# Patient Record
Sex: Male | Born: 1990 | Race: Black or African American | Hispanic: No | Marital: Single | Smoking: Former smoker
Health system: Southern US, Community
[De-identification: ages and names within clinical notes are randomized; demographics above are authoritative.]

## PROBLEM LIST (undated history)

## (undated) ENCOUNTER — Ambulatory Visit: Admission: EM | Payer: Self-pay

## (undated) DIAGNOSIS — J9383 Other pneumothorax: Secondary | ICD-10-CM

## (undated) HISTORY — PX: HERNIA REPAIR: SHX51

## (undated) HISTORY — PX: LUNG SURGERY: SHX703

---

## 2017-04-17 ENCOUNTER — Ambulatory Visit
Admission: EM | Admit: 2017-04-17 | Discharge: 2017-04-17 | Disposition: A | Discharge status: Home / Self Care | Payer: 59 | Attending: Family Medicine | Admitting: Family Medicine

## 2017-04-17 ENCOUNTER — Encounter: Payer: Self-pay | Admitting: Emergency Medicine

## 2017-04-17 ENCOUNTER — Ambulatory Visit: Admission: EM | Admit: 2017-04-17 | Discharge: 2017-04-17 | Discharge status: Absent without leave | Payer: Self-pay

## 2017-04-17 DIAGNOSIS — G43909 Migraine, unspecified, not intractable, without status migrainosus: Secondary | ICD-10-CM | POA: Diagnosis not present

## 2017-04-17 NOTE — ED Provider Notes (Signed)
CSN: [id number]     Arrival date & time 04/17/17  1602 History   First MD Initiated Contact with Patient 04/17/17 1722     Chief Complaint  Patient presents with  . Headache   (Consider location/radiation/quality/duration/timing/severity/associated sxs/prior Treatment) Patient is a 26 year old male who presents with complaint of migraine. Patient states he got a migraine at work and took Tylenol about 2 hours ago. Patient states his headache has improved. Initially his pain was before head above the eyes and is now more of a generalized headache. Patient states these above the eyes type of headache is consistent with his migraines that he had as a child. Patient reports photophobia with this headache is the worst. Patient denies any numbness or tingling. Patient denies any vision changes.  Patient works doing all changes on semi trucks and requested to go home to sleep off his migraine. Patient states his boss told him he would need to be seen in get a note before he can return to work.      History reviewed. No pertinent past medical history. Past Surgical History:  Procedure Laterality Date  . HERNIA REPAIR     History reviewed. No pertinent family history. Social History  Substance Use Topics  . Smoking status: Current Every Day Smoker    Types: Cigarettes  . Smokeless tobacco: Never Used  . Alcohol use No    Review of Systems  as stated above in history of present illness. Other systems reviewed and found to be negative   Allergies  Patient has no known allergies.  Home Medications   Prior to Admission medications   Not on File   Meds Ordered and Administered this Visit  Medications - No data to display  BP 130/79 (BP Location: Left Arm)   Pulse 77   Temp 98.6 F (37 C) (Oral)   Resp 16   Ht 6\' 6"  (1.981 m)   Wt 180 lb (81.6 kg)   SpO2 100%   BMI 20.80 kg/m  No data found.   Physical Exam  Constitutional: He is oriented to person, place, and time. He  appears well-developed and well-nourished. No distress.  HENT:  Head: Normocephalic and atraumatic.  Eyes: Pupils are equal, round, and reactive to light. Conjunctivae and EOM are normal.  Cardiovascular: Normal rate and regular rhythm.   Pulmonary/Chest: Effort normal.  Musculoskeletal: Normal range of motion.  Neurological: He is alert and oriented to person, place, and time. He has normal strength. No cranial nerve deficit.  Is fine motor intact. Fine motor intact.  Skin: Skin is warm and dry.    Urgent Care Course     Procedures (including critical care time)  Labs Review Labs Reviewed - No data to display  Imaging Review No results found.   MDM   1. Migraine without status migrainosus, not intractable, unspecified migraine type    Patient presents with complaint of migraine that he had earlier today at work that has improved somewhat with Tylenol. Patient's migraine has gone from headache pain above both eyes (that is consistent with his migraines as a child) and now is more of a generalized headache. Patient refused Toradol for his pain. Patient to be allowed to return to work tomorrow.  Candis SchatzMichael D Laurrie Toppin, PA-C    Candis SchatzHarris, Chucky Homes D, PA-C 04/17/17 1737

## 2017-04-17 NOTE — ED Triage Notes (Signed)
Patient c/o HA that started today.  Patient states the has only taken Tylenol for his HA.  Patient states that he left work early today and needs a work note.

## 2017-04-17 NOTE — Discharge Instructions (Signed)
-   continue with Tylenol as needed for pain - avoid aggravating factor

## 2017-06-12 ENCOUNTER — Ambulatory Visit
Admission: EM | Admit: 2017-06-12 | Discharge: 2017-06-12 | Disposition: A | Discharge status: Home / Self Care | Payer: 59 | Attending: Family Medicine | Admitting: Family Medicine

## 2017-06-12 ENCOUNTER — Encounter: Payer: Self-pay | Admitting: *Deleted

## 2017-06-12 ENCOUNTER — Ambulatory Visit (INDEPENDENT_AMBULATORY_CARE_PROVIDER_SITE_OTHER): Payer: 59

## 2017-06-12 DIAGNOSIS — S60222A Contusion of left hand, initial encounter: Secondary | ICD-10-CM

## 2017-06-12 DIAGNOSIS — S0083XA Contusion of other part of head, initial encounter: Secondary | ICD-10-CM | POA: Diagnosis not present

## 2017-06-12 DIAGNOSIS — S62613A Displaced fracture of proximal phalanx of left middle finger, initial encounter for closed fracture: Secondary | ICD-10-CM
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
## 2017-06-12 NOTE — Discharge Instructions (Signed)
Ice. Keep in splint. Rest. Drink plenty of fluids.   Follow up with orthopedic in one week.   Follow up with your primary care physician this week as needed. Return to Urgent care for new or worsening concerns.

## 2018-03-25 ENCOUNTER — Ambulatory Visit
Admission: EM | Admit: 2018-03-25 | Discharge: 2018-03-25 | Disposition: A | Discharge status: Nursing Home (Includes ICF) | Payer: Self-pay | Attending: Emergency Medicine | Admitting: Emergency Medicine

## 2018-03-25 ENCOUNTER — Ambulatory Visit (INDEPENDENT_AMBULATORY_CARE_PROVIDER_SITE_OTHER): Payer: Self-pay

## 2018-03-25 DIAGNOSIS — F17210 Nicotine dependence, cigarettes, uncomplicated: Secondary | ICD-10-CM | POA: Insufficient documentation

## 2018-03-25 DIAGNOSIS — Z9889 Other specified postprocedural states: Secondary | ICD-10-CM | POA: Insufficient documentation

## 2018-03-25 DIAGNOSIS — R9389 Abnormal findings on diagnostic imaging of other specified body structures: Secondary | ICD-10-CM | POA: Insufficient documentation

## 2018-03-25 DIAGNOSIS — J930 Spontaneous tension pneumothorax: Secondary | ICD-10-CM

## 2018-03-25 DIAGNOSIS — R079 Chest pain, unspecified: Secondary | ICD-10-CM

## 2018-03-25 DIAGNOSIS — R0602 Shortness of breath: Secondary | ICD-10-CM | POA: Insufficient documentation

## 2018-03-25 NOTE — ED Provider Notes (Addendum)
[facility]    CSN: [id number] Arrival date & time: 03/25/18  1234     History   Chief Complaint Chief Complaint  Patient presents with  . Chest Pain    HPI Anthony Farmer is a 27 y.o. male.   HPI  27 year old male states that yesterday was doing fine but woke up this morning with chest pain the left side of his chest with the shortness of breath shoulder pain extending into his left neck.  States it also radiates into his back.  The patient states that when he lies back or lying flat is when it is the most severe.  He has no other chest pain.  He has no previous medical problems.  Is not having any respiratory distress at the present time.       History reviewed. No pertinent past medical history.  There are no active problems to display for this patient.   Past Surgical History:  Procedure Laterality Date  . HERNIA REPAIR         Home Medications    Prior to Admission medications   Not on File    Family History No family history on file.  Social History Social History   Tobacco Use  . Smoking status: Current Every Day Smoker    Types: Cigarettes  . Smokeless tobacco: Never Used  Substance Use Topics  . Alcohol use: No  . Drug use: No     Allergies   Patient has no known allergies.   Review of Systems Review of Systems  Constitutional: Positive for activity change. Negative for chills, fatigue and fever.  Respiratory: Positive for shortness of breath.   All other systems reviewed and are negative.    Physical Exam Triage Vital Signs ED Triage Vitals  Enc Vitals Group     BP 03/25/18 1259 118/77     Pulse Rate 03/25/18 1259 64     Resp 03/25/18 1259 18     Temp 03/25/18 1259 98.6 F (37 C)     Temp Source 03/25/18 1259 Oral     SpO2 03/25/18 1259 100 %     Weight 03/25/18 1301 185 lb (83.9 kg)     Height --      Head Circumference --      Peak Flow --      Pain Score 03/25/18 1301 8     Pain Loc --      Pain Edu?  --      Excl. in GC? --    No data found.  Updated Vital Signs BP 118/77 (BP Location: Left Arm)   Pulse 64   Temp 98.6 F (37 C) (Oral)   Resp 18   Wt 185 lb (83.9 kg)   SpO2 100%   BMI 21.94 kg/m   Visual Acuity Right Eye Distance:   Left Eye Distance:   Bilateral Distance:    Right Eye Near:   Left Eye Near:    Bilateral Near:     Physical Exam  Constitutional: He is oriented to person, place, and time. He appears well-developed and well-nourished.  Non-toxic appearance. He does not appear ill. No distress.  HENT:  Head: Normocephalic.  Eyes: Pupils are equal, round, and reactive to light.  Neck: Normal range of motion.  Cardiovascular: Normal rate and regular rhythm.  Pulmonary/Chest: Effort normal and breath sounds normal. No respiratory distress.  Musculoskeletal: Normal range of motion.  Neurological: He is alert and oriented to person, place, and time.  Skin: Skin is warm and dry.  Psychiatric: He has a normal mood and affect. His behavior is normal.  Nursing note and vitals reviewed.    UC Treatments / Results  Labs (all labs ordered are listed, but only abnormal results are displayed) Labs Reviewed - No data to display  EKG None  Radiology Dg Chest 2 View  Result Date: 03/25/2018 CLINICAL DATA:  LEFT sided chest pain since this morning. Symptoms confirmed with Dr. Phillis Knackoemer. EXAM: CHEST - 2 VIEW COMPARISON:  None. FINDINGS: There is a large LEFT pneumothorax, estimated to be 50% of lung volume. There is depression of the LEFT hemidiaphragm and minimal LEFT-to-RIGHT mediastinal shift. RIGHT lung is clear. No acute fracture. IMPRESSION: LEFT tension pneumothorax. Critical Value/emergent results were called by telephone at the time of interpretation on 03/25/2018 at 2:37 pm to Dr. Ovid CurdWILLIAM Keelyn Fjelstad , who verbally acknowledged these results. Electronically Signed   By: Norva PavlovElizabeth  Brown M.D.   On: 03/25/2018 15:01    Procedures Procedures (including critical care  time)  Medications Ordered in UC Medications - No data to display  Initial Impression / Assessment and Plan / UC Course  I have reviewed the triage vital signs and the nursing notes.  Pertinent labs & imaging results that were available during my care of the patient were reviewed by me and considered in my medical decision making (see chart for details).     . Patient found to have a pneumothorax of 50% on the left with a tension component.  Presently  stable with O2 sats at 100%.  Fully explained to the patient and his wife.  Send him by EMS for chest tube thoracostomy . Final Clinical Impressions(s) / UC Diagnoses   Final diagnoses:  Spontaneous tension pneumothorax   Discharge Instructions   None    ED Prescriptions    None     Controlled Substance Prescriptions [place] Controlled Substance Registry consulted? Not Applicable   Lutricia FeilRoemer, Roselina Burgueno P, PA-C 03/25/18 1830    Lutricia Feiloemer, Yannely Kintzel P, PA-C 03/25/18 1832

## 2018-03-25 NOTE — ED Triage Notes (Signed)
Pt states he woke up this morning with chest pain on the left side of his chest, SOB, left shoulder pain and left sided neck pain. Also states it radiates to his back. Never had this before pt reported. No otc meds tried.

## 2018-10-05 ENCOUNTER — Other Ambulatory Visit: Payer: Self-pay

## 2018-10-05 ENCOUNTER — Emergency Department: Payer: Self-pay

## 2018-10-05 ENCOUNTER — Emergency Department
Admission: EM | Admit: 2018-10-05 | Discharge: 2018-10-05 | Disposition: A | Discharge status: Home / Self Care | Payer: Self-pay | Attending: Emergency Medicine | Admitting: Emergency Medicine

## 2018-10-05 ENCOUNTER — Encounter: Payer: Self-pay | Admitting: Emergency Medicine

## 2018-10-05 DIAGNOSIS — Z8709 Personal history of other diseases of the respiratory system: Secondary | ICD-10-CM | POA: Insufficient documentation

## 2018-10-05 DIAGNOSIS — F17210 Nicotine dependence, cigarettes, uncomplicated: Secondary | ICD-10-CM | POA: Insufficient documentation

## 2018-10-05 DIAGNOSIS — Z880 Allergy status to penicillin: Secondary | ICD-10-CM | POA: Insufficient documentation

## 2018-10-05 DIAGNOSIS — Z902 Acquired absence of lung [part of]: Secondary | ICD-10-CM | POA: Insufficient documentation

## 2018-10-05 DIAGNOSIS — R079 Chest pain, unspecified: Secondary | ICD-10-CM | POA: Insufficient documentation

## 2018-10-05 HISTORY — DX: Other pneumothorax: J93.83

## 2018-10-05 LAB — BASIC METABOLIC PANEL
Anion gap: 5 (ref 5–15)
BUN: 12 mg/dL (ref 6–20)
CHLORIDE: 105 mmol/L (ref 98–111)
CO2: 27 mmol/L (ref 22–32)
CREATININE: 1.24 mg/dL (ref 0.61–1.24)
Calcium: 8.6 mg/dL — ABNORMAL LOW (ref 8.9–10.3)
Glucose, Bld: 96 mg/dL (ref 70–99)
POTASSIUM: 3.8 mmol/L (ref 3.5–5.1)
Sodium: 137 mmol/L (ref 135–145)

## 2018-10-05 LAB — CBC
HCT: 43.5 % (ref 39.0–52.0)
HEMOGLOBIN: 14.4 g/dL (ref 13.0–17.0)
MCH: 28.9 pg (ref 26.0–34.0)
MCHC: 33.1 g/dL (ref 30.0–36.0)
MCV: 87.3 fL (ref 80.0–100.0)
PLATELETS: 218 10*3/uL (ref 150–400)
RBC: 4.98 MIL/uL (ref 4.22–5.81)
RDW: 14.1 % (ref 11.5–15.5)
WBC: 4.3 10*3/uL (ref 4.0–10.5)
nRBC: 0 % (ref 0.0–0.2)

## 2018-10-05 LAB — TROPONIN I: Troponin I: <0.03 ng/mL (ref <0.03)

## 2018-10-05 MED ORDER — IOHEXOL 350 MG/ML SOLN
75.0000 mL | Freq: Once | INTRAVENOUS | Status: AC | PRN
  Administered 2018-10-05: 75 mL via INTRAVENOUS

## 2018-10-05 MED ORDER — ACETAMINOPHEN 500 MG PO TABS
1000.0000 mg | ORAL_TABLET | Freq: Once | ORAL | Status: DC
  Filled 2018-10-05: qty 2

## 2018-10-05 NOTE — ED Provider Notes (Signed)
[facility] Emergency Department Provider Note  ____________________________________________  Time seen: Approximately 8:55 AM  I have reviewed the triage vital signs and the nursing notes.   HISTORY  Chief Complaint Chest Pain    HPI Anthony Farmer is a 28 y.o. male with a history of spontaneous left pneumothorax 7/19 who underwent left upper lobe wedge resection to remove multiple apical blebs, pleurodesis, presenting with left-sided chest pain.  Patient reports that for the past 3 days, he has had a sharp left-sided chest pain that is worse with deep breaths and constant.  He has no associated palpitations, diaphoresis, nausea or vomiting.  He has not had any cough or cold symptoms, fevers or chills.  No lower extremity swelling or calf pain.  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  Past Medical History:  Diagnosis Date  . Spontaneous pneumothorax     There are no active problems to display for this patient.   Past Surgical History:  Procedure Laterality Date  . HERNIA REPAIR    . LUNG SURGERY        Allergies Penicillin g and Tape  History reviewed. No pertinent family history.  Social History Social History   Tobacco Use  . Smoking status: Current Every Day Smoker    Types: Cigarettes  . Smokeless tobacco: Never Used  Substance Use Topics  . Alcohol use: Yes    Comment: Occ  . Drug use: No    Review of Systems Constitutional: No fever/chills. Eyes: No visual changes. ENT: No sore throat. No congestion or rhinorrhea. Cardiovascular: Positive chest pain. Denies palpitations. Respiratory: Denies shortness of breath.  No cough. Gastrointestinal: No abdominal pain.  No nausea, no vomiting.  No diarrhea.  No constipation. Genitourinary: Negative for dysuria. Musculoskeletal: Negative for back pain. Skin: Negative for rash. Neurological: Negative for headaches. No focal numbness, tingling or weakness.      ____________________________________________   PHYSICAL EXAM:  VITAL SIGNS: ED Triage Vitals [10/05/18 0839]  Enc Vitals Group     BP 122/71     Pulse Rate 80     Resp 16     Temp      Temp src      SpO2 100 %     Weight 170 lb (77.1 kg)     Height 6\' 5"  (1.956 m)     Head Circumference      Peak Flow      Pain Score 5     Pain Loc      Pain Edu?      Excl. in GC?     Constitutional: Alert and oriented.  Answers questions appropriately. Eyes: Conjunctivae are normal.  EOMI. No scleral icterus. Head: Atraumatic. Nose: No congestion/rhinnorhea. Mouth/Throat: Mucous membranes are moist.  Neck: No stridor.  Supple.  No JVD. Cardiovascular: Normal rate, regular rhythm. No murmurs, rubs or gallops.  Respiratory: Normal respiratory effort.  No accessory muscle use or retractions. Lungs CTAB in the upper and lower lobes, from the lateral aspect, and auscultated from the front of the chest as well..  No wheezes, rales or ronchi.  O2 sats are 100% on room air. Gastrointestinal: Soft, nontender and nondistended.  No guarding or rebound.  No peritoneal signs. Musculoskeletal: No LE edema. No ttp in the calves or palpable cords.  Negative Homan's sign. Neurologic:  A&Ox3.  Speech is clear.  Face and smile are symmetric.  EOMI.  Moves all extremities well. Skin:  Skin is warm, dry and intact.  No rash noted. Psychiatric: Mood and affect are normal. Speech and behavior are normal.  Normal judgement.  ____________________________________________   LABS (all labs ordered are listed, but only abnormal results are displayed)  Labs Reviewed  BASIC METABOLIC PANEL - Abnormal; Notable for the following components:      Result Value   Calcium 8.6 (*)    All other components within normal limits  CBC  TROPONIN I   ____________________________________________  EKG  ED ECG REPORT I, Anne-Caroline Sharma Covert, the attending physician, personally viewed and interpreted this ECG.    Date: 10/05/2018  EKG Time: 839  Rate: 81  Rhythm: normal sinus rhythm  Axis: normal  Intervals:none  ST&T Change: No STEMI  ____________________________________________  RADIOLOGY  Dg Chest 2 View  Result Date: 10/05/2018 CLINICAL DATA:  Left-sided chest pain with deep breaths. EXAM: CHEST - 2 VIEW COMPARISON:  03/25/2018 FINDINGS: Lungs appear hyperexpanded. The lungs are clear without focal pneumonia, edema, pneumothorax or pleural effusion. Staple line noted left lung apex. The cardiopericardial silhouette is within normal limits for size. The visualized bony structures of the thorax are intact. IMPRESSION: No active cardiopulmonary disease.  No evidence for pneumothorax. Electronically Signed   By: Kennith Center M.D.   On: 10/05/2018 09:11   Ct Angio Chest Pe W And/or Wo Contrast  Result Date: 10/05/2018 CLINICAL DATA:  Chest pain EXAM: CT ANGIOGRAPHY CHEST WITH CONTRAST TECHNIQUE: Multidetector CT imaging of the chest was performed using the standard protocol during bolus administration of intravenous contrast. Multiplanar CT image reconstructions and MIPs were obtained to evaluate the vascular anatomy. CONTRAST:  10mL OMNIPAQUE IOHEXOL 350 MG/ML SOLN COMPARISON:  Chest radiograph October 05, 2018 FINDINGS: Cardiovascular: There is no demonstrable pulmonary embolus. There is no thoracic aortic aneurysm or dissection. Visualized great vessels appear unremarkable. No pericardial effusion or pericardial thickening evident. Mediastinum/Nodes: Visualized thyroid appears unremarkable. There is no evident thoracic adenopathy. No esophageal lesions are appreciable. Lungs/Pleura: There is no pneumothorax evident. There is scarring in the left upper lobe toward the apex. There is no appreciable edema or consolidation. There is no pleural effusion or pleural thickening evident. Upper Abdomen: Visualized upper abdominal structures appear unremarkable. Musculoskeletal: No blastic or lytic bone lesions. No  evident chest wall lesions. Review of the MIP images confirms the above findings. IMPRESSION: 1. No demonstrable pulmonary embolus. No thoracic aortic aneurysm or dissection. 2. Scarring left upper lobe toward the apex. No edema or consolidation. No evident pneumothorax. 3.  No demonstrable thoracic adenopathy. Electronically Signed   By: Bretta Bang III M.D.   On: 10/05/2018 11:23    ____________________________________________   PROCEDURES  Procedure(s) performed: None  Procedures  Critical Care performed: No ____________________________________________   INITIAL IMPRESSION / ASSESSMENT AND PLAN / ED COURSE  Pertinent labs & imaging results that were available during my care of the patient were reviewed by me and considered in my medical decision making (see chart for details).  28 y.o. male with a prior history of pneumothorax and left upper lobe wedge resection for blebs, pleurodesis, presenting with left-sided chest pain.  Overall, the patient is hemodynamically stable.  He has no evidence of respiratory compromise at this time and his auscultation exam does reveal normal breath sounds on the left side.  A chest x-ray has been ordered.  If this is normal, we will plan to do a CT scan given his extensive pulmonary history.  Basic laboratory studies have also been ordered.  A cardiac cause for the patient symptoms is unlikely,  but a troponin has been ordered.  I do not see ischemic changes or arrhythmia on his EKG.  I have ordered acetaminophen for his pain.  Plan reevaluation for final disposition.  ----------------------------------------- 11:51 AM on 10/05/2018 -----------------------------------------  The patient's CT scan does not show any acute causes for his chest pain.  His troponin is negative.  His electrolytes and blood counts are also within normal limits.  The patient's pain has resolved with Tylenol.  At this time, the patient is safe for discharge home.  I did  discuss follow-up instructions as well as return precautions with him.  ____________________________________________  FINAL CLINICAL IMPRESSION(S) / ED DIAGNOSES  Final diagnoses:  Left-sided chest pain         NEW MEDICATIONS STARTED DURING THIS VISIT:  New Prescriptions   No medications on file      Rockne Menghini, MD 10/05/18 1152

## 2018-10-05 NOTE — ED Triage Notes (Signed)
History of spontaneous pneumothorax 2 months ago with chest tube X 2.  2-3 days ago started with left side chest pain feeling similar.  Unlabored at this time with stable vitals.  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  Tall and slender composure.  Left side non radiating chest pain.

## 2018-10-05 NOTE — Discharge Instructions (Addendum)
You may take Tylenol or Motrin for your chest pain.  Please return to the emergency department if you develop severe pain, lightheadedness or fainting, fever, shortness of breath, or any other symptoms concerning to you.

## 2019-11-22 ENCOUNTER — Emergency Department (HOSPITAL_COMMUNITY)
Admission: EM | Admit: 2019-11-22 | Discharge: 2019-11-22 | Disposition: A | Discharge status: Home / Self Care | Payer: Self-pay | Attending: Emergency Medicine | Admitting: Emergency Medicine

## 2019-11-22 ENCOUNTER — Emergency Department (HOSPITAL_COMMUNITY): Payer: Self-pay

## 2019-11-22 ENCOUNTER — Encounter (HOSPITAL_COMMUNITY): Payer: Self-pay | Admitting: Emergency Medicine

## 2019-11-22 ENCOUNTER — Other Ambulatory Visit: Payer: Self-pay

## 2019-11-22 DIAGNOSIS — Z79899 Other long term (current) drug therapy: Secondary | ICD-10-CM | POA: Insufficient documentation

## 2019-11-22 DIAGNOSIS — S0101XA Laceration without foreign body of scalp, initial encounter: Secondary | ICD-10-CM | POA: Insufficient documentation

## 2019-11-22 DIAGNOSIS — Y939 Activity, unspecified: Secondary | ICD-10-CM | POA: Insufficient documentation

## 2019-11-22 DIAGNOSIS — S0990XA Unspecified injury of head, initial encounter: Secondary | ICD-10-CM | POA: Insufficient documentation

## 2019-11-22 DIAGNOSIS — F17210 Nicotine dependence, cigarettes, uncomplicated: Secondary | ICD-10-CM | POA: Insufficient documentation

## 2019-11-22 DIAGNOSIS — Y999 Unspecified external cause status: Secondary | ICD-10-CM | POA: Insufficient documentation

## 2019-11-22 DIAGNOSIS — Y929 Unspecified place or not applicable: Secondary | ICD-10-CM | POA: Insufficient documentation

## 2019-11-22 DIAGNOSIS — W228XXA Striking against or struck by other objects, initial encounter: Secondary | ICD-10-CM | POA: Insufficient documentation

## 2019-11-22 MED ORDER — ACETAMINOPHEN 325 MG PO TABS
650.0000 mg | ORAL_TABLET | Freq: Once | ORAL | Status: AC
  Administered 2019-11-22: 650 mg via ORAL
  Filled 2019-11-22: qty 2
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
## 2019-11-22 NOTE — ED Notes (Addendum)
Patient has no difficulty hearing this Clinical research associate while asking assessment questions.   Patient has dried blood present on LFT sided ear.
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
## 2019-11-22 NOTE — ED Notes (Signed)
Patient's LFT ear was cleaned with normal saline. Patient has no complaints of pain.

## 2019-11-22 NOTE — Discharge Instructions (Addendum)
As discussed, your CT scan was negative for any abnormalities. You may take over the counter ibuprofen or tylenol as needed for pain. I have included the number for [facility] number. Call to schedule an appointment if symptoms do not improve within the next week. Return to the ER for new or worsening symptoms.

## 2020-06-15 IMAGING — CT CT HEAD W/O CM
3 series · 15 of 47 positions shown, 18 images · non-contrast
Comparison: None.

CLINICAL DATA: Head trauma. Headache.

EXAM:
CT HEAD WITHOUT CONTRAST
TECHNIQUE: Contiguous axial images were obtained from the base of the skull
through the vertex without intravenous contrast.

[Series 2: head wo · axial · 0.47mm/px · z∈[-136,-11]mm · 9 of 31 slices shown, 12 images]
[im 3/31  brain]
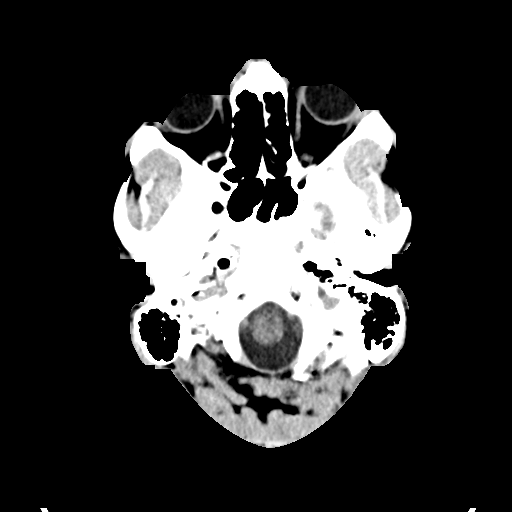
[im 3/31  bone]
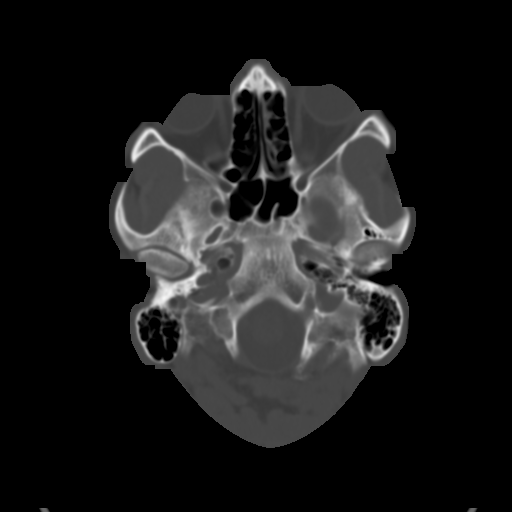
[im 6/31  brain]
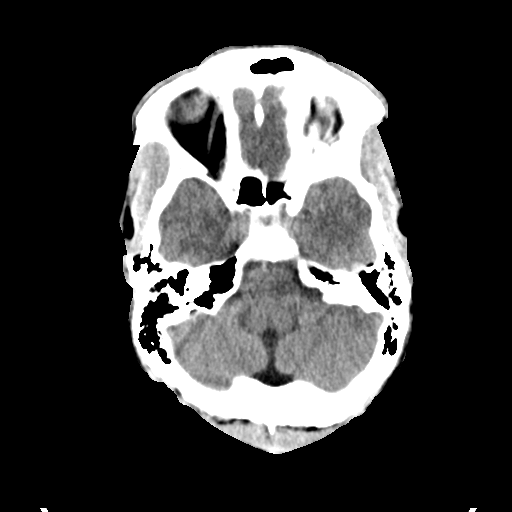
[im 9/31  brain]
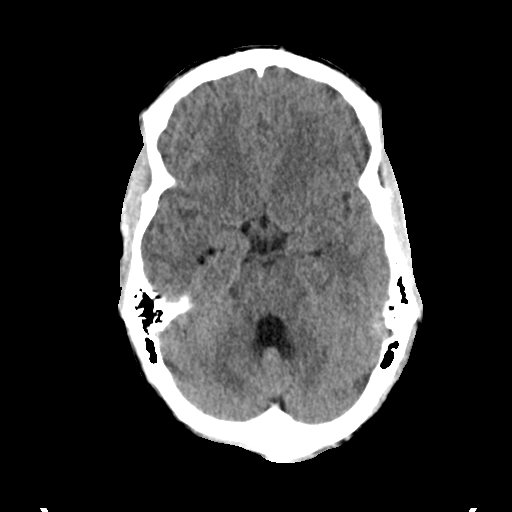
[im 12/31  brain]
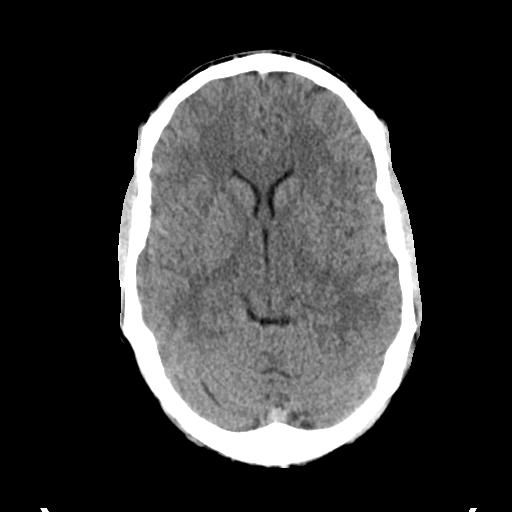
[im 16/31  brain]
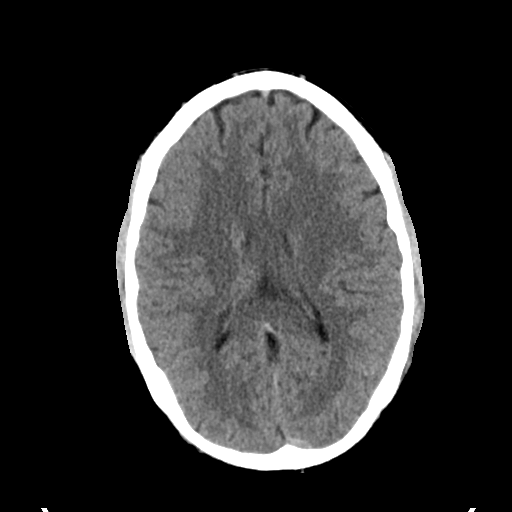
[im 16/31  bone]
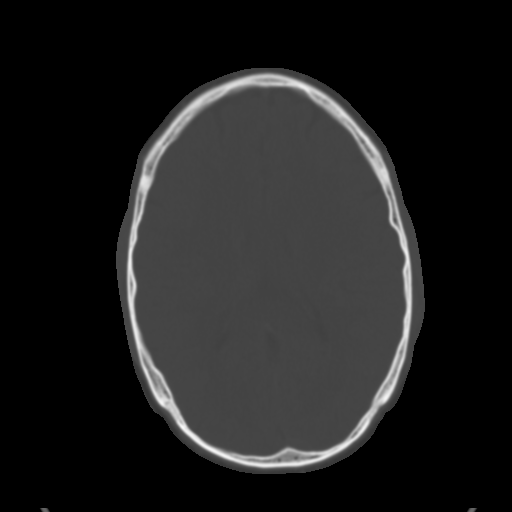
[im 19/31  brain]
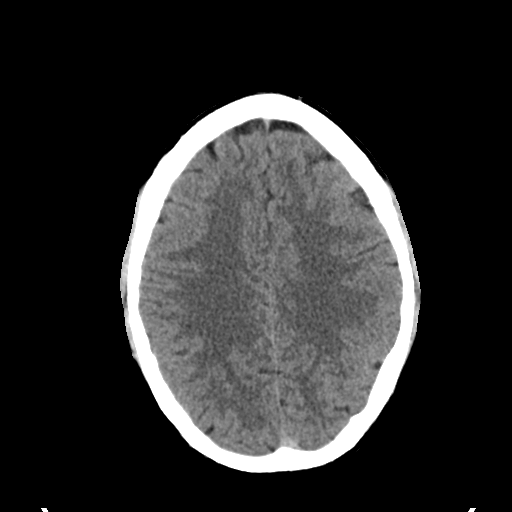
[im 22/31  brain]
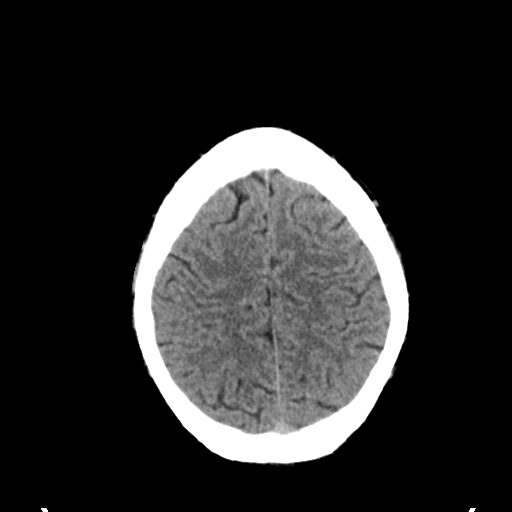
[im 25/31  brain]
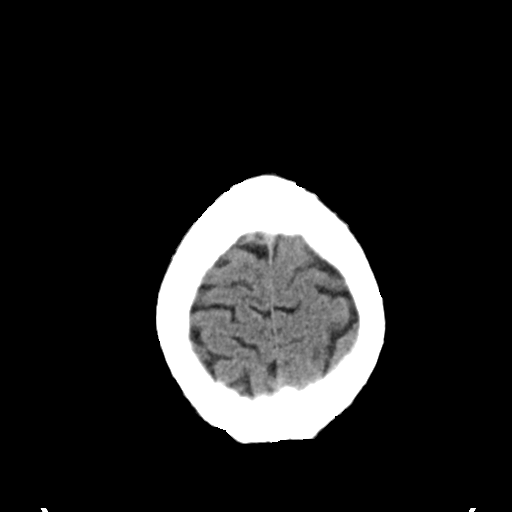
[im 28/31  brain]
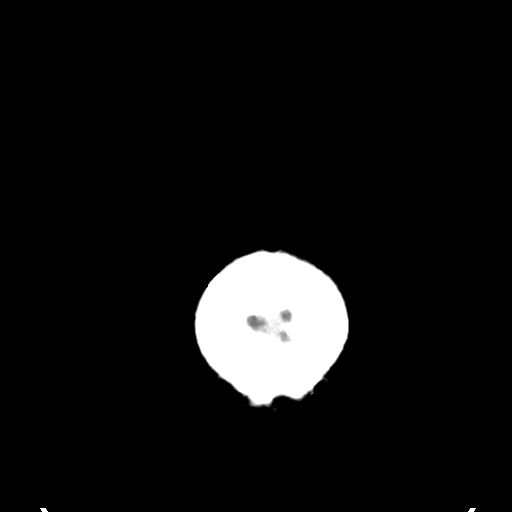
[im 28/31  bone]
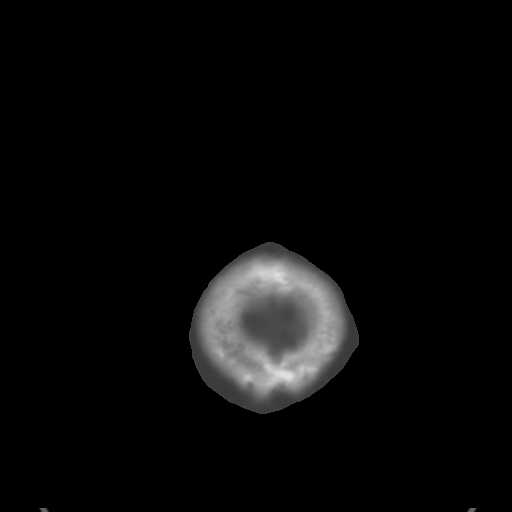

[Series 4: coronal soft tissue · coronal · 0.31mm/px · 3 of 75 slices shown]
[im 25/75  brain]
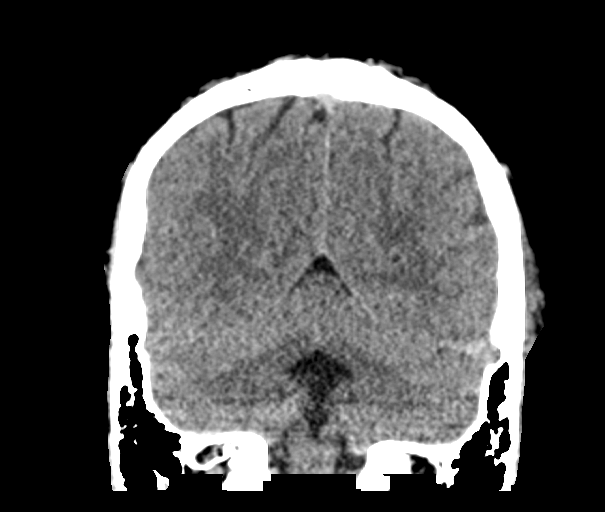
[im 33/75  brain]
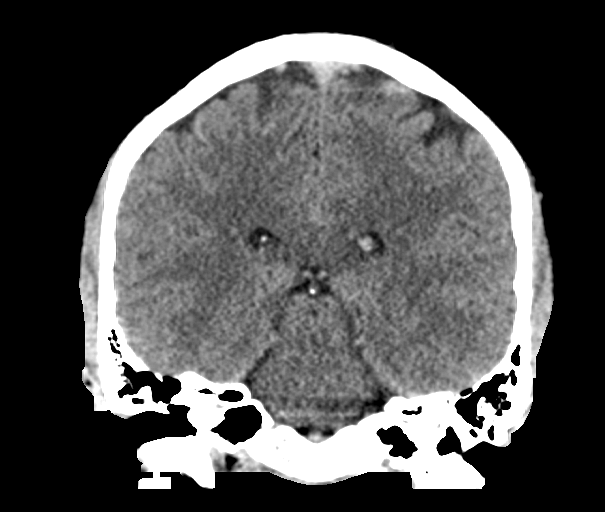
[im 42/75  brain]
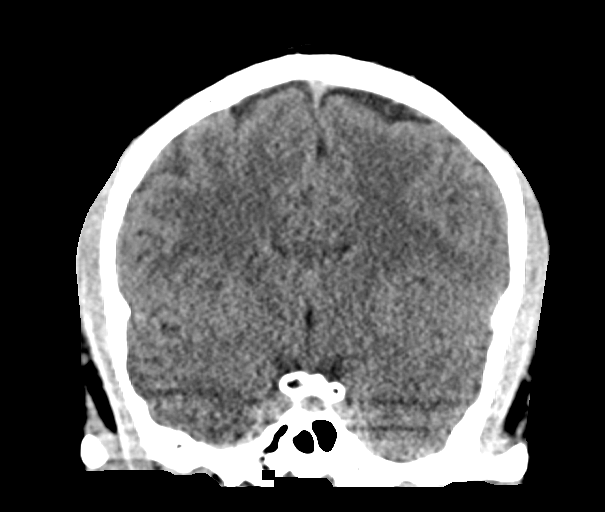

[Series 5: sagittal soft tissue · sagittal · 0.31mm/px · 3 of 67 slices shown]
[im 23/67  brain]
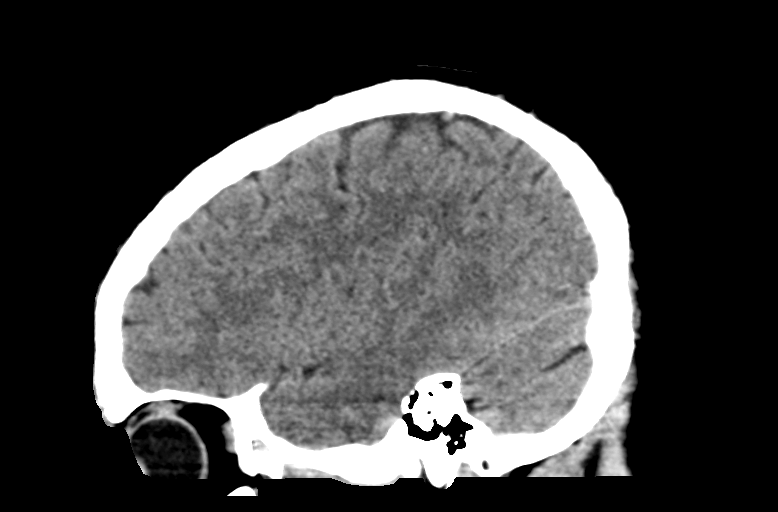
[im 34/67  brain]
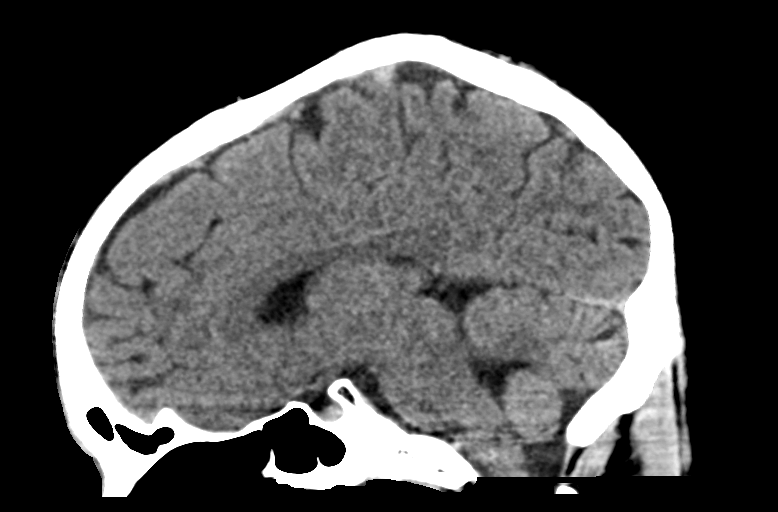
[im 45/67  brain]
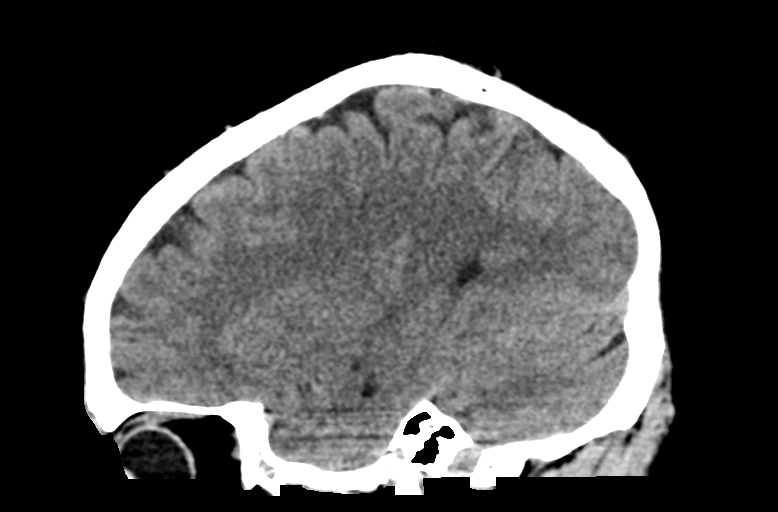

[15 of 47 positions shown; findings below may reference images not displayed]

FINDINGS: Brain: The ventricles are normal in size and configuration. No
extra-axial fluid collections are identified. The gray-white
differentiation is maintained. No CT findings for acute hemispheric
infarction or intracranial hemorrhage. No mass lesions. The
brainstem and cerebellum are normal.

Vascular: No hyperdense vessels or obvious aneurysm.

Skull: No acute skull fracture. No bone lesion.

Sinuses/Orbits: The paranasal sinuses and mastoid air cells are
clear. The globes are intact.

Other: No scalp lesions, laceration or hematoma.
IMPRESSION: Normal head CT.

## 2021-06-22 ENCOUNTER — Ambulatory Visit: Payer: Self-pay

## 2021-06-23 ENCOUNTER — Ambulatory Visit: Payer: Self-pay | Admitting: Physician Assistant

## 2021-06-23 ENCOUNTER — Other Ambulatory Visit: Payer: Self-pay

## 2021-06-23 DIAGNOSIS — Z202 Contact with and (suspected) exposure to infections with a predominantly sexual mode of transmission: Secondary | ICD-10-CM

## 2021-06-23 DIAGNOSIS — Z113 Encounter for screening for infections with a predominantly sexual mode of transmission: Secondary | ICD-10-CM

## 2021-06-23 MED ORDER — DOXYCYCLINE HYCLATE 100 MG PO TABS: 100.0000 mg | ORAL_TABLET | Freq: Two times a day (BID) | ORAL | 0 refills | Status: AC

## 2021-06-25 ENCOUNTER — Encounter: Payer: Self-pay | Admitting: Physician Assistant

## 2021-06-25 NOTE — Progress Notes (Signed)
   [facility] Department STI clinic/screening visit  Subjective:  Anthony Farmer is a 30 y.o. male being seen today for an STI screening visit. The patient reports they do not have symptoms.    Patient has the following medical conditions:  There are no problems to display for this patient.    Chief Complaint  Patient presents with   SEXUALLY TRANSMITTED DISEASE    Screening/contact to Chlamydia    HPI  Patient reports that he is not having any symptoms but is a contact to Chlamydia.  Denies chronic conditions and regular medicines.  States last HIV test was in 2019.     Screening for MPX risk: Does the patient have an unexplained rash? No Is the patient MSM? No Does the patient endorse multiple sex partners or anonymous sex partners? No Did the patient have close or sexual contact with a person diagnosed with MPX? No Has the patient traveled outside the [place] where MPX is endemic? No Is there a high clinical suspicion for MPX-- evidenced by one of the following No  -Unlikely to be chickenpox  -Lymphadenopathy  -Rash that present in same phase of evolution on any given body part   See flowsheet for further details and programmatic requirements.    The following portions of the patient's history were reviewed and updated as appropriate: allergies, current medications, past medical history, past social history, past surgical history and problem list.  Objective:  There were no vitals filed for this visit.  Physical Exam Constitutional:      General: He is not in acute distress.    Appearance: Normal appearance.  HENT:     Head: Normocephalic and atraumatic.  Eyes:     Conjunctiva/sclera: Conjunctivae normal.  Pulmonary:     Effort: Pulmonary effort is normal.  Skin:    General: Skin is warm and dry.  Neurological:     Mental Status: He is alert and oriented to person, place, and time.  Psychiatric:        Mood and Affect: Mood normal.        Behavior:  Behavior normal.        Thought Content: Thought content normal.        Judgment: Judgment normal.      Assessment and Plan:  Anthony Farmer is a 30 y.o. male presenting to the [facility] Department for STI screening  1. Screening for STD (sexually transmitted disease) Patient into clinic without symptoms. Patient declines blood work and screening exam today.  Requests treatment only.  Rec condoms with all sex.    2. Chlamydia contact Treat as a contact to Chlamydia with Doxycycline 100 mg #14 1 po BID for 7 days. No sex for 14 days and until after partner completes treatment. Call with questions or concerns.  - doxycycline (VIBRA-TABS) 100 MG tablet; Take 1 tablet (100 mg total) by mouth 2 (two) times daily for 7 days.  Dispense: 14 tablet; Refill: 0     No follow-ups on file.  No future appointments.  Matt Holmes, PA

## 2021-06-27 NOTE — Progress Notes (Signed)
Chart reviewed by Pharmacist  Suzanne Walker PharmD, Contract Pharmacist at [facility]  

## 2022-05-06 ENCOUNTER — Other Ambulatory Visit: Payer: Self-pay

## 2022-05-06 ENCOUNTER — Emergency Department
Admission: EM | Admit: 2022-05-06 | Discharge: 2022-05-06 | Disposition: A | Discharge status: Home / Self Care | Payer: Self-pay | Attending: Emergency Medicine | Admitting: Emergency Medicine

## 2022-05-06 ENCOUNTER — Emergency Department: Payer: Self-pay

## 2022-05-06 ENCOUNTER — Encounter: Payer: Self-pay | Admitting: Emergency Medicine

## 2022-05-06 DIAGNOSIS — S92421A Displaced fracture of distal phalanx of right great toe, initial encounter for closed fracture: Secondary | ICD-10-CM | POA: Insufficient documentation

## 2022-05-06 DIAGNOSIS — W108XXA Fall (on) (from) other stairs and steps, initial encounter: Secondary | ICD-10-CM | POA: Insufficient documentation

## 2022-05-06 MED ORDER — ACETAMINOPHEN 325 MG PO TABS
650.0000 mg | ORAL_TABLET | Freq: Once | ORAL | Status: AC
  Administered 2022-05-06: 650 mg via ORAL
  Filled 2022-05-06: qty 2

## 2022-05-06 MED ORDER — IBUPROFEN 600 MG PO TABS
600.0000 mg | ORAL_TABLET | Freq: Once | ORAL | Status: AC
  Administered 2022-05-06: 600 mg via ORAL
  Filled 2022-05-06: qty 1

## 2022-05-06 MED ORDER — NAPROXEN 500 MG PO TABS: 500.0000 mg | ORAL_TABLET | Freq: Two times a day (BID) | ORAL | 2 refills | Status: DC

## 2022-05-06 MED ORDER — LIDOCAINE HCL (PF) 1 % IJ SOLN
5.0000 mL | Freq: Once | INTRAMUSCULAR | Status: AC
  Administered 2022-05-06: 5 mL
  Filled 2022-05-06: qty 5

## 2022-05-06 NOTE — ED Triage Notes (Signed)
Presents with pain to right foot   States he fell on his steps  hitting his right great toe  Having pain at toe  also having a burning sensation to lateral foot

## 2022-05-06 NOTE — ED Provider Notes (Signed)
[facility] Provider Note    Event Date/Time   First MD Initiated Contact with Patient 05/06/22 [phone]     (approximate)   History   Foot Injury   HPI  Anthony Farmer is a 31 y.o. male with no reported past medical history who presents today for evaluation of right great toe pain.  Patient reports that he tripped going up the stairs.  He reports that he has pain in his great toe.  He has not had any trouble walking.  No numbness or tingling.  No open wounds.  He denies any other injuries.  There are no problems to display for this patient.         Physical Exam   Triage Vital Signs: ED Triage Vitals  Enc Vitals Group     BP 05/06/22 0950 104/79     Pulse Rate 05/06/22 0950 69     Resp 05/06/22 0950 17     Temp 05/06/22 0950 97.7 F (36.5 C)     Temp Source 05/06/22 0950 Oral     SpO2 05/06/22 0950 98 %     Weight 05/06/22 0946 192 lb 0.3 oz (87.1 kg)     Height 05/06/22 0946 6\' 5"  (1.956 m)     Head Circumference --      Peak Flow --      Pain Score 05/06/22 0958 10     Pain Loc --      Pain Edu? --      Excl. in GC? --     Most recent vital signs: Vitals:   05/06/22 0950  BP: 104/79  Pulse: 69  Resp: 17  Temp: 97.7 F (36.5 C)  SpO2: 98%    Physical Exam Vitals and nursing note reviewed.  Constitutional:      General: Awake and alert. No acute distress.    Appearance: Normal appearance. The patient is normal weight.  HENT:     Head: Normocephalic and atraumatic.     Mouth: Mucous membranes are moist.  Eyes:     General: PERRL. Normal EOMs        Right eye: No discharge.        Left eye: No discharge.     Conjunctiva/sclera: Conjunctivae normal.  Cardiovascular:     Rate and Rhythm: Normal rate and regular rhythm.     Pulses: Normal pulses.  Pulmonary:     Effort: Pulmonary effort is normal. No respiratory distress.  Abdominal:     Abdomen is soft. There is no abdominal tenderness.  Musculoskeletal:        General:  No swelling. Normal range of motion.     Cervical back: Normal range of motion and neck supple.  Right great toe: No obvious deformities, erythema, ecchymosis, swelling, or open wounds.  Toe deviates towards rest of toes, though equal to opposite.  Able to wiggle all toes.  Normal capillary refill.  Normal pedal pulses.  No tenderness to palpation to the dorsum of the foot.  No medial or lateral malleoli or tenderness.  Foot is warm and well-perfused.  No plantar ecchymosis Skin:    General: Skin is warm and dry.     Capillary Refill: Capillary refill takes less than 2 seconds.     Findings: No rash.  Neurological:     Mental Status: The patient is awake and alert.      ED Results / Procedures / Treatments   Labs (all labs ordered are listed, but only  abnormal results are displayed) Labs Reviewed - No data to display   EKG     RADIOLOGY I independently reviewed and interpreted imaging and agree with radiologists findings.     PROCEDURES:  Critical Care performed:   .Ortho Injury Treatment  Date/Time: 05/06/2022 12:13 PM  Performed by: Jackelyn Hoehn, PA-C Authorized by: Jackelyn Hoehn, PA-C   Consent:    Consent obtained:  Verbal   Consent given by:  Patient   Risks discussed:  Fracture, irreducible dislocation, nerve damage, recurrent dislocation, stiffness, restricted joint movement and vascular damage   Alternatives discussed:  No treatmentInjury location: toe Location details: right great toe Injury type: fracture Fracture type: distal phalanx Pre-procedure neurovascular assessment: neurovascularly intact Pre-procedure distal perfusion: normal Pre-procedure neurological function: normal Pre-procedure range of motion: reduced Anesthesia: digital block  Anesthesia: Local anesthesia used: yes Local Anesthetic: lidocaine 1% without epinephrine  Patient sedated: NoManipulation performed: yes Reduction successful: yes X-ray confirmed reduction: Patient decline  post reduction film, but reports it looks back to normal. Immobilization: tape, brace and crutches Splint type: CAM boot. Splint Applied by: ED Tech Post-procedure neurovascular assessment: post-procedure neurovascularly intact Post-procedure distal perfusion: normal Post-procedure neurological function: normal Post-procedure range of motion: improved      MEDICATIONS ORDERED IN ED: Medications  acetaminophen (TYLENOL) tablet 650 mg (650 mg Oral Given 05/06/22 1003)  ibuprofen (ADVIL) tablet 600 mg (600 mg Oral Given 05/06/22 1003)  lidocaine (PF) (XYLOCAINE) 1 % injection 5 mL (5 mLs Infiltration Given by Other 05/06/22 1127)     IMPRESSION / MDM / ASSESSMENT AND PLAN / ED COURSE  I reviewed the triage vital signs and the nursing notes.   Differential diagnosis includes, but is not limited to, fracture, dislocation, contusion, sprain.  Patient is awake and alert, hemodynamically stable and neurovascularly intact.  No open wounds.  X-ray was obtained and demonstrates severely displaced fracture involving the proximal base of the first distal phalanx with intra-articular extension.  This is closed.  Patient agreed to reduction which was performed after digital block.  Palpable and audible reduction with normal alignment postreduction.  Patient declined postreduction film.  X-ray was reviewed by Dr. Logan Bores with podiatry who reports that he will arrange close outpatient follow-up and call the patient for an appointment on Monday most likely.  In the meantime, patient was instructed to continue to apply ice and elevate his foot.  He was given a cam boot and crutches.  We discussed return precautions and the importance of close outpatient follow-up.  Patient understands and agrees with plan.  Discharged in stable condition.   Patient's presentation is most consistent with acute complicated illness / injury requiring diagnostic workup.   Clinical Course as of 05/06/22 1215  Fri May 06, 2022   1135 Discussed with Dr. Logan Bores with podiatry who will arrange follow up [JP]    Clinical Course User Index [JP] Jamiaya Bina, Herb Grays, PA-C     FINAL CLINICAL IMPRESSION(S) / ED DIAGNOSES   Final diagnoses:  Displaced fracture of distal phalanx of right great toe, initial encounter for closed fracture     Rx / DC Orders   ED Discharge Orders          Ordered    naproxen (NAPROSYN) 500 MG tablet  2 times daily with meals        05/06/22 1155             Note:  This document was prepared using Dragon voice recognition software and may include  unintentional dictation errors.   Keturah Shavers 05/06/22 1215    Minna Antis, MD 05/06/22 [phone]

## 2022-05-06 NOTE — Discharge Instructions (Signed)
Your x-ray reveals a displaced fracture of your great toe that goes into the joint.  This was put back into alignment.  Your case was discussed with the podiatrist who would like to see you probably on Monday.  He will call you to arrange this appointment.  If you do not hear from him by then, please call him.  Please keep your leg elevated.  You may continue to apply ice to the area after wrapping the ice and a towel to not burn your skin.  Return for any new, worsening, or change in symptoms or other concerns.  Was a pleasure caring for you today.

## 2022-05-06 NOTE — ED Notes (Signed)
Pt refused crutches.

## 2022-05-10 ENCOUNTER — Ambulatory Visit: Payer: Self-pay | Admitting: Podiatry

## 2022-05-25 ENCOUNTER — Telehealth: Payer: Self-pay

## 2022-05-25 ENCOUNTER — Ambulatory Visit (INDEPENDENT_AMBULATORY_CARE_PROVIDER_SITE_OTHER): Payer: PRIVATE HEALTH INSURANCE

## 2022-05-25 ENCOUNTER — Ambulatory Visit (INDEPENDENT_AMBULATORY_CARE_PROVIDER_SITE_OTHER): Payer: PRIVATE HEALTH INSURANCE | Admitting: Podiatry

## 2022-05-25 DIAGNOSIS — S92424A Nondisplaced fracture of distal phalanx of right great toe, initial encounter for closed fracture: Secondary | ICD-10-CM | POA: Diagnosis not present

## 2022-05-25 MED ORDER — HYDROCODONE-ACETAMINOPHEN 5-325 MG PO TABS: 1.0000 | ORAL_TABLET | Freq: Every evening | ORAL | 0 refills | Status: DC | PRN

## 2022-05-25 NOTE — Telephone Encounter (Signed)
Patient is calling for medication refill of the hydrocodone -ace. Refill has been sent. Patient will call back when another refill is needed.

## 2022-05-25 NOTE — Progress Notes (Signed)
   Chief Complaint  Patient presents with   broken right great toe    Patient is here due to breaking right great toe 3 weeks ago.    HPI: 31 y.o. male presenting today as a new patient referral from the emergency department for evaluation of a fracture of the right great toe.  Patient states that on 05/06/2022 he stepped wrong and stubbed his toe.  He went to the emergency department and was diagnosed with a fracture.  He presents here for follow-up treatment and evaluation  Past Medical History:  Diagnosis Date   Spontaneous pneumothorax     Past Surgical History:  Procedure Laterality Date   HERNIA REPAIR     LUNG SURGERY      Allergies  Allergen Reactions   Penicillin G     Other reaction(s): Unknown Mother thinks he had a rash with this as a toddler Other reaction(s): Unknown Mother thinks he had a rash with this as a toddler    Tape Rash     Physical Exam: General: The patient is alert and oriented x3 in no acute distress.  Dermatology: Skin is warm, dry and supple bilateral lower extremities. Negative for open lesions or macerations.  Vascular: Palpable pedal pulses bilaterally. Capillary refill within normal limits.  Negative for any significant edema or erythema  Neurological: Light touch and protective threshold grossly intact  Musculoskeletal Exam: No pedal deformities noted  Radiographic Exam:  Minimally displaced fracture involving the proximal base of the first distal phalanx with intra-articular extension.  The fracture fragment appears significantly reduced compared to prior x-rays taken in the ED  Assessment: 1.  Fracture distal phalanx right great toe, closed, minimally displaced, initial encounter   Plan of Care:  1. Patient evaluated. X-Rays reviewed.  2.  Pursue conservative treatment.  Recommend minimal weightbearing as tolerated 3.  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  Recommend postponing his start date for an  additional 2 weeks to allow him time for his toe to stabilize and heal 4.  Prescription for Vicodin 5/325 mg nightly 5.  Return to clinic 8 weeks for follow-up x-ray      Felecia Shelling, DPM [facility]  Dr. Felecia Shelling, DPM    [address] [place], [place] [address]                Office [phone]  Fax [phone]

## 2022-05-25 NOTE — Telephone Encounter (Signed)
Thanks, Dr. Emarion Toral

## 2022-05-30 ENCOUNTER — Telehealth: Payer: Self-pay | Admitting: *Deleted

## 2022-05-30 NOTE — Telephone Encounter (Signed)
No refills of pain medicine.  I gave him 20 pills last visit to take nightly.  That should last him 20 days.  Thanks, Dr. Amalia Hailey

## 2022-05-30 NOTE — Telephone Encounter (Signed)
Patient is calling to request a pain medicine refill, would like a stronger dosage if possible, to help with broken right toe,previous hydrocodone is not strong enough, something else to help w/ sleeping.  Please advise

## 2022-06-01 ENCOUNTER — Other Ambulatory Visit: Payer: Self-pay | Admitting: Podiatry

## 2022-06-01 MED ORDER — HYDROCODONE-ACETAMINOPHEN 5-325 MG PO TABS
1.0000 | ORAL_TABLET | Freq: Every evening | ORAL | 0 refills | Status: DC | PRN
Start: 2022-06-01 — End: 2022-07-25

## 2022-06-01 NOTE — Telephone Encounter (Signed)
Patient is calling back to ask for another refill, was only given 5 pills by pharmacy since this was his first time taking this medicine and 20 tablets would have taken him over the Mme equivalent. Please advise.

## 2022-06-02 ENCOUNTER — Telehealth: Payer: Self-pay | Admitting: Podiatry

## 2022-06-02 DIAGNOSIS — M79676 Pain in unspecified toe(s): Secondary | ICD-10-CM

## 2022-06-02 NOTE — Telephone Encounter (Signed)
How long will patient be out of work? 

## 2022-06-03 NOTE — Telephone Encounter (Signed)
6 weeks from date of injury.  DOI: 05/06/2022.  Thanks, Dr. Amalia Hailey

## 2022-06-06 ENCOUNTER — Telehealth: Payer: Self-pay | Admitting: *Deleted

## 2022-06-06 ENCOUNTER — Telehealth: Payer: Self-pay | Admitting: Podiatry

## 2022-06-06 NOTE — Telephone Encounter (Signed)
Spoke with patient giving him the new extension date to return back to work,stated that he will reach back out to employer to get disability paperwork and will bring into the [place] location.

## 2022-06-06 NOTE — Telephone Encounter (Signed)
Totally fine. We can push his return to work date out to 08/06/22 (3 mos from date of injury) if needed... he has a pretty bad fracture of his great toe and he is on his feet all day lifting packages. - Dr. Amalia Hailey

## 2022-06-06 NOTE — Telephone Encounter (Signed)
Patient is calling because he may need more time if his foot is still swollen by Oct. 7 th when he is supposed to returned back to work.  Would more time this be allowed? Please advise.

## 2022-06-06 NOTE — Telephone Encounter (Signed)
Left message notifying patient that Dr. Amalia Hailey will release him to return to work on 06/18/2022. Dr. Amalia Hailey said he only needed to be out of work for 6 weeks from [place] 05/06/2022. I still have not received any disability paperwork for this patient.

## 2022-06-20 ENCOUNTER — Telehealth: Payer: Self-pay

## 2022-06-21 NOTE — Telephone Encounter (Signed)
Called patient, no answer, could not leave voice message.

## 2022-06-21 NOTE — Telephone Encounter (Signed)
Patient is calling to request a stronger pain medicine,still having pain and the hydrocodone-ace ,5-325 mg is not helping. Please advise.

## 2022-06-21 NOTE — Telephone Encounter (Signed)
No dose change. No additional refills. Patient needs f/u in office if he is continuing to have pain. - Dr. Amalia Hailey

## 2022-06-22 ENCOUNTER — Telehealth: Payer: Self-pay

## 2022-06-22 NOTE — Telephone Encounter (Signed)
Noted, thanks!

## 2022-06-23 ENCOUNTER — Telehealth: Payer: Self-pay | Admitting: Podiatry

## 2022-06-23 NOTE — Telephone Encounter (Signed)
Good Morning, Patient is concerned that his words about starting a new job is being used against him, as far as his Disability goes. Anthony Farmer said that he wear steel toe shoes with his job at the [employer] he works for. 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   I received documentation asking if patient really needs to be out of work until 07/25/2022?  If patient is able to return to work with restrictions, before 07/25/2022?  What is the medical complications and the debilitating symptoms preventing him from returning to work?

## 2022-06-25 NOTE — Telephone Encounter (Signed)
Anthony Farmer, Answer: Yes, patient needs to be out of work until 07/25/2022.  Diagnosis: Comminuted right great toe fracture sustained 05/06/2022.  Sorry it sounds like this has been quite the ordeal for you regarding paperwork for this patient. Thanks so much, Dr. Amalia Hailey

## 2022-07-25 ENCOUNTER — Ambulatory Visit (INDEPENDENT_AMBULATORY_CARE_PROVIDER_SITE_OTHER): Payer: PRIVATE HEALTH INSURANCE

## 2022-07-25 ENCOUNTER — Ambulatory Visit (INDEPENDENT_AMBULATORY_CARE_PROVIDER_SITE_OTHER): Payer: PRIVATE HEALTH INSURANCE | Admitting: Podiatry

## 2022-07-25 DIAGNOSIS — S92424A Nondisplaced fracture of distal phalanx of right great toe, initial encounter for closed fracture: Secondary | ICD-10-CM

## 2022-07-25 MED ORDER — IBUPROFEN 800 MG PO TABS: 800.0000 mg | ORAL_TABLET | Freq: Three times a day (TID) | ORAL | 1 refills | Status: DC

## 2022-07-25 MED ORDER — HYDROCODONE-ACETAMINOPHEN 5-325 MG PO TABS: 1.0000 | ORAL_TABLET | Freq: Every evening | ORAL | 0 refills | Status: DC | PRN

## 2022-07-25 NOTE — Progress Notes (Signed)
   Chief Complaint  Patient presents with   follow -up     Patient is here for right foot great toe.the patient states that he is still having pain and swelling, patient states that the medication is not helping.    HPI: 31 y.o. male presenting today for follow-up evaluation of the right great toe fracture sustained on 05/06/2022 when he stepped wrong and stubbed his toe.  Over the past 8 weeks he has had pain and tenderness with ambulation.  There has been slow improvement.  He continues to have some swelling to the area.  He has not returned to work since the injury  Past Medical History:  Diagnosis Date   Spontaneous pneumothorax     Past Surgical History:  Procedure Laterality Date   HERNIA REPAIR     LUNG SURGERY      Allergies  Allergen Reactions   Penicillin G     Other reaction(s): Unknown Mother thinks he had a rash with this as a toddler Other reaction(s): Unknown Mother thinks he had a rash with this as a toddler    Tape Rash     Physical Exam: General: The patient is alert and oriented x3 in no acute distress.  Dermatology: Skin is warm, dry and supple bilateral lower extremities. Negative for open lesions or macerations.  Vascular: Palpable pedal pulses bilaterally. Capillary refill within normal limits.  Negative for any significant edema or erythema  Neurological: Light touch and protective threshold grossly intact  Musculoskeletal Exam: No pedal deformities noted  Radiographic Exam RT foot 07/25/2022:  Routine healing noted compared to prior x-rays.  Displacement unchanged.  Minimally displaced fracture involving the proximal base of the first distal phalanx with intra-articular extension.  Fracture fragment stable with routine healing noted.  Assessment: 1.  Fracture distal phalanx right great toe, closed, minimally displaced, subsequent encounter with routine healing   Plan of Care:  1. Patient evaluated. X-Rays reviewed.  2.  Refill prescription  for Vicodin 5/325 mg nightly 3.  Prescription for ibuprofen 800 mg 3 times daily 4.  Patient continues to have pain and tenderness and swelling to the toe.  Currently he is not in a position to return to work due to the job demands of being on his feet.  Refrain from work an additional 6 weeks.  Plan to return to work beginning of the year 09/12/2022 5.  Return to clinic in 6 weeks for follow-up x-ray and reevaluation     Felecia Shelling, DPM [facility]  Dr. Felecia Shelling, DPM    [address] [place], [place] [address]                Office [phone]  Fax [phone]

## 2022-09-19 ENCOUNTER — Ambulatory Visit (INDEPENDENT_AMBULATORY_CARE_PROVIDER_SITE_OTHER): Payer: PRIVATE HEALTH INSURANCE | Admitting: Podiatry

## 2022-09-19 ENCOUNTER — Encounter: Payer: Self-pay | Admitting: Podiatry

## 2022-09-19 VITALS — BP 124/58 | HR 60

## 2022-09-19 DIAGNOSIS — S92424A Nondisplaced fracture of distal phalanx of right great toe, initial encounter for closed fracture: Secondary | ICD-10-CM | POA: Diagnosis not present

## 2022-09-19 NOTE — Progress Notes (Signed)
   Chief Complaint  Patient presents with   Fracture    Right hallux fracture, patient only has pain when he bends the toe, rate of pain 6 out of 10, X-rays taken today     HPI: 32 y.o. male presenting today for follow-up evaluation of the right great toe fracture sustained on 05/06/2022 when he stepped wrong and stubbed his toe.  Patient continues to have improvement.  He does state that recently he excellently stubbed his toe and had significant pain and tenderness.  Overall doing well however.  Past Medical History:  Diagnosis Date   Spontaneous pneumothorax     Past Surgical History:  Procedure Laterality Date   HERNIA REPAIR     LUNG SURGERY      Allergies  Allergen Reactions   Penicillin G     Other reaction(s): Unknown Mother thinks he had a rash with this as a toddler Other reaction(s): Unknown Mother thinks he had a rash with this as a toddler    Tape Rash     Physical Exam: General: The patient is alert and oriented x3 in no acute distress.  Dermatology: Skin is warm, dry and supple bilateral lower extremities. Negative for open lesions or macerations.  Vascular: Palpable pedal pulses bilaterally. Capillary refill within normal limits.  Negative for any significant edema or erythema  Neurological: Light touch and protective threshold grossly intact  Musculoskeletal Exam: No pedal deformities noted  Radiographic Exam RT foot 09/19/2022:  Routine healing noted compared to prior x-rays.  Displacement unchanged.  Minimally displaced fracture involving the proximal base of the first distal phalanx with intra-articular extension.  Fracture fragment stable with routine healing noted.  Assessment: 1.  Fracture distal phalanx right great toe, closed, minimally displaced, subsequent encounter with routine healing -Patient evaluated.  X-rays reviewed today. -X-rays demonstrate good routine healing of the fracture sites.  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  This will be chronic -Patient continues to have some slight tenderness however he has noticed significant improvement.  He may now slowly increase to full activity no restrictions beginning 10/13/2022 -Note for work was provided today.  Return to work 10/13/2018 for full activity no restrictions -Return to clinic as needed  Edrick Kins, DPM [facility]  Dr. Edrick Kins, DPM    [address], [place] [address]                Office [phone]  Fax [phone]

## 2022-10-15 ENCOUNTER — Emergency Department: Payer: Self-pay

## 2022-10-15 ENCOUNTER — Emergency Department
Admission: EM | Admit: 2022-10-15 | Discharge: 2022-10-15 | Disposition: A | Discharge status: Home / Self Care | Payer: Self-pay | Attending: Emergency Medicine | Admitting: Emergency Medicine

## 2022-10-15 DIAGNOSIS — M545 Low back pain, unspecified: Secondary | ICD-10-CM | POA: Insufficient documentation

## 2022-10-15 DIAGNOSIS — I861 Scrotal varices: Secondary | ICD-10-CM | POA: Insufficient documentation

## 2022-10-15 DIAGNOSIS — W010XXA Fall on same level from slipping, tripping and stumbling without subsequent striking against object, initial encounter: Secondary | ICD-10-CM | POA: Insufficient documentation

## 2022-10-15 LAB — BASIC METABOLIC PANEL
Anion gap: 7 (ref 5–15)
BUN: 16 mg/dL (ref 6–20)
CO2: 23 mmol/L (ref 22–32)
Calcium: 9 mg/dL (ref 8.9–10.3)
Chloride: 106 mmol/L (ref 98–111)
Creatinine, Ser: 1.01 mg/dL (ref 0.61–1.24)
GFR, Estimated: >60 mL/min (ref >60)
Glucose, Bld: 85 mg/dL (ref 70–99)
Potassium: 3.9 mmol/L (ref 3.5–5.1)
Sodium: 136 mmol/L (ref 135–145)

## 2022-10-15 LAB — URINALYSIS, ROUTINE W REFLEX MICROSCOPIC
Bilirubin Urine: NEGATIVE
Glucose, UA: NEGATIVE mg/dL
Hgb urine dipstick: NEGATIVE
Ketones, ur: NEGATIVE mg/dL
Leukocytes,Ua: NEGATIVE
Nitrite: NEGATIVE
Protein, ur: NEGATIVE mg/dL
Specific Gravity, Urine: 1.021 (ref 1.005–1.030)
pH: 5 (ref 5.0–8.0)

## 2022-10-15 LAB — CBC WITH DIFFERENTIAL/PLATELET
Abs Immature Granulocytes: 0 10*3/uL (ref 0.00–0.07)
Basophils Absolute: 0 10*3/uL (ref 0.0–0.1)
Basophils Relative: 1 %
Eosinophils Absolute: 0.1 10*3/uL (ref 0.0–0.5)
Eosinophils Relative: 2 %
HCT: 40.6 % (ref 39.0–52.0)
Hemoglobin: 13.5 g/dL (ref 13.0–17.0)
Immature Granulocytes: 0 %
Lymphocytes Relative: 54 %
Lymphs Abs: 2.3 10*3/uL (ref 0.7–4.0)
MCH: 28.5 pg (ref 26.0–34.0)
MCHC: 33.3 g/dL (ref 30.0–36.0)
MCV: 85.8 fL (ref 80.0–100.0)
Monocytes Absolute: 0.4 10*3/uL (ref 0.1–1.0)
Monocytes Relative: 10 %
Neutro Abs: 1.4 10*3/uL — ABNORMAL LOW (ref 1.7–7.7)
Neutrophils Relative %: 33 %
Platelets: 206 10*3/uL (ref 150–400)
RBC: 4.73 MIL/uL (ref 4.22–5.81)
RDW: 13.2 % (ref 11.5–15.5)
WBC: 4.3 10*3/uL (ref 4.0–10.5)
nRBC: 0 % (ref 0.0–0.2)

## 2022-10-15 MED ORDER — NAPROXEN 500 MG PO TABS: 500.0000 mg | ORAL_TABLET | Freq: Two times a day (BID) | ORAL | 0 refills | Status: AC

## 2022-10-15 MED ORDER — KETOROLAC TROMETHAMINE 15 MG/ML IJ SOLN
15.0000 mg | Freq: Once | INTRAMUSCULAR | Status: AC
  Administered 2022-10-15: 15 mg via INTRAVENOUS

## 2022-10-15 MED ORDER — KETOROLAC TROMETHAMINE 15 MG/ML IJ SOLN
15.0000 mg | Freq: Once | INTRAMUSCULAR | Status: DC
  Filled 2022-10-15: qty 1

## 2022-10-15 NOTE — Discharge Instructions (Signed)
Your ultrasound reveals a varicocele.  Please wear more supportive underwear.  Please follow-up with urology if your symptoms persist.  You may take the medicine to help with your pain.  Your blood tests, urinalysis, and x-ray were normal.  Please return for any new, worsening, or change in symptoms or other concerns.  It was a pleasure caring for you today.

## 2022-10-15 NOTE — ED Notes (Signed)
Blood sent for 2nd time

## 2022-10-15 NOTE — ED Provider Notes (Signed)
[facility] Provider Note    Event Date/Time   First MD Initiated Contact with Patient 10/15/22 1310     (approximate)   History   Back Pain   HPI  Anthony Farmer is a 32 y.o. male who presents with multiple complaints.  He reports that he is low back pain after a trip and fall that occurred last week.  He reports that this pain has improved, and does not radiate into his leg or into his abdomen.  Secondly, he reports that he has had left testicular pain for approximately 1 week.  He denies being sexually active in the past month.  He has not had any high risk sexual activity.  He denies any burning with urination or penile discharge.  No fevers or chills.  There are no problems to display for this patient.         Physical Exam   Triage Vital Signs: ED Triage Vitals  Enc Vitals Group     BP 10/15/22 1250 121/63     Pulse Rate 10/15/22 1250 60     Resp 10/15/22 1250 18     Temp 10/15/22 1250 98 F (36.7 C)     Temp Source 10/15/22 1250 Oral     SpO2 10/15/22 1250 99 %     Weight 10/15/22 1246 189 lb (85.7 kg)     Height 10/15/22 1246 6\' 5"  (1.956 m)     Head Circumference --      Peak Flow --      Pain Score 10/15/22 1246 10     Pain Loc --      Pain Edu? --      Excl. in [place]? --     Most recent vital signs: Vitals:   10/15/22 1250  BP: 121/63  Pulse: 60  Resp: 18  Temp: 98 F (36.7 C)  SpO2: 99%    Physical Exam Vitals and nursing note reviewed.  Constitutional:      General: Awake and alert. No acute distress.    Appearance: Normal appearance. The patient is normal weight.  HENT:     Head: Normocephalic and atraumatic.     Mouth: Mucous membranes are moist.  Eyes:     General: PERRL. Normal EOMs        Right eye: No discharge.        Left eye: No discharge.     Conjunctiva/sclera: Conjunctivae normal.  Cardiovascular:     Rate and Rhythm: Normal rate and regular rhythm.     Pulses: Normal pulses.  Pulmonary:      Effort: Pulmonary effort is normal. No respiratory distress.     Breath sounds: Normal breath sounds.  Abdominal:     Abdomen is soft. There is no abdominal tenderness. No rebound or guarding. No distention. GU: Normal-appearing penis and testicles with mild tenderness to palpation to the posterior part of the left testicle.  Normal testicular lie.  No erythema or ecchymosis noted.  No lesions noted. Musculoskeletal:        General: No swelling. Normal range of motion.     Cervical back: Normal range of motion and neck supple.  Back: No midline tenderness. Strength and sensation 5/5 to bilateral lower extremities. Normal great toe extension against resistance. Normal sensation throughout feet. Normal patellar reflexes. Negative SLR and opposite SLR bilaterally. Negative FABER test Skin:    General: Skin is warm and dry.     Capillary Refill: Capillary refill takes less than  2 seconds.     Findings: No rash.  Neurological:     Mental Status: The patient is awake and alert.      ED Results / Procedures / Treatments   Labs (all labs ordered are listed, but only abnormal results are displayed) Labs Reviewed  URINALYSIS, ROUTINE W REFLEX MICROSCOPIC - Abnormal; Notable for the following components:      Result Value   Color, Urine YELLOW (*)    APPearance CLEAR (*)    All other components within normal limits  CBC WITH DIFFERENTIAL/PLATELET - Abnormal; Notable for the following components:   Neutro Abs 1.4 (*)    All other components within normal limits  BASIC METABOLIC PANEL     EKG     RADIOLOGY I independently reviewed and interpreted imaging and agree with radiologists findings.     PROCEDURES:  Critical Care performed:   Procedures   MEDICATIONS ORDERED IN ED: Medications  ketorolac (TORADOL) 15 MG/ML injection 15 mg (15 mg Intravenous Given 10/15/22 1336)     IMPRESSION / MDM / ASSESSMENT AND PLAN / ED COURSE  I reviewed the triage vital signs and the  nursing notes.   Differential diagnosis includes, but is not limited to, epididymitis, orchitis, UTI, hydrocele, varicocele, lumbar strain, compression fracture.  Patient is awake and alert, hemodynamically stable and afebrile.  He has normal testicular lie, no erythema or ecchymosis noted, no masses palpated.  Urinalysis is clean.  He denies any risky sexual activity.  Ultrasound reveals a large left varicocele, no testicular problems.  We discussed this diagnosis and I recommended scrotal support and outpatient follow-up with urology, the appropriate information was provided.  His labs are overall reassuring.  His x-ray of his back is normal.  He reports that his pain has resolved completely after the Toradol.  He has 5 out of 5 strength with intact sensation to extensor hallucis dorsiflexion and plantarflexion of bilateral lower extremities with normal patellar reflexes bilaterally. Most likely etiology at this point is muscle strain vs herniated disc. No red flags to indicate patient is at risk for more auspicious process that would require urgent/emergent spinal imaging or subspecialty evaluation at this time. No major trauma, no midline tenderness, no history or physical exam findings to suggest cauda equina syndrome or spinal cord compression. No focal neurological deficits on exam. No constitutional symptoms or history of immunosuppression or IVDA to suggest potential for epidural abscess. Not anticoagulated, no history of bleeding diastasis to suggest risk for epidural hematoma. No chronic steroid use or advanced age or history of malignancy to suggest proclivity towards pathological fracture.  No abdominal pain or flank pain to suggest kidney stone, no history of kidney stone.  No fever or dysuria or CVAT to suggest pyelonephritis .  No chest pain, back pain, shortness of breath, neurological deficits, to suggest vascular catastrophe, and pulses are equal in all 4 extremities.    Discussed care  instructions and return precautions with patient. Recommended close outpatient follow-up for re-evaluation. Patient agrees with plan of care. Will treat the patient symptomatically as needed for pain control. Will discharge patient to take these medications and return for any worsening or different pain or development of any neurologic symptoms. Educated patient regarding expected time course for back pain to improve and recommended very close outpatient follow-up.   Patient's presentation is most consistent with acute complicated illness / injury requiring diagnostic workup.    FINAL CLINICAL IMPRESSION(S) / ED DIAGNOSES   Final diagnoses:  Left varicocele  Acute bilateral low back pain without sciatica     Rx / DC Orders   ED Discharge Orders          Ordered    naproxen (NAPROSYN) 500 MG tablet  2 times daily with meals        10/15/22 1515             Note:  This document was prepared using Dragon voice recognition software and may include unintentional dictation errors.   Emeline Gins 10/15/22 1725    Lavonia Drafts, MD 10/15/22 [phone]

## 2022-10-15 NOTE — ED Triage Notes (Signed)
Pt presents to the ED via POV due to back pain x3 weeks. Pt states pain has gotten worse over time. Pt states he is having difficulty bending, standing and getting up from a laying to stand position. Pt states its starting to radiate down his legs. Pt denies urinary symptoms. Pt also c/o of having some swelling in his "private area". Pt A&Ox4

## 2023-12-23 ENCOUNTER — Ambulatory Visit (HOSPITAL_COMMUNITY)
Admission: RE | Admit: 2023-12-23 | Discharge: 2023-12-23 | Disposition: A | Discharge status: Home / Self Care | Payer: Self-pay | Source: Ambulatory Visit

## 2023-12-23 ENCOUNTER — Encounter (HOSPITAL_COMMUNITY): Payer: Self-pay

## 2023-12-23 VITALS — BP 115/68 | HR 62 | Temp 98.1°F | Resp 16 | Ht 77.0 in | Wt 189.0 lb

## 2023-12-23 DIAGNOSIS — B370 Candidal stomatitis: Secondary | ICD-10-CM

## 2023-12-23 MED ORDER — FLUCONAZOLE 200 MG PO TABS: 200.0000 mg | ORAL_TABLET | Freq: Every day | ORAL | 0 refills | Status: AC

## 2023-12-23 MED ORDER — LIDOCAINE VISCOUS HCL 2 % MT SOLN: 15.0000 mL | OROMUCOSAL | 0 refills | Status: AC | PRN

## 2023-12-23 MED ORDER — NYSTATIN 100000 UNIT/ML MT SUSP: 5.0000 mL | Freq: Four times a day (QID) | OROMUCOSAL | 0 refills | Status: AC | PRN

## 2023-12-23 NOTE — ED Provider Notes (Signed)
 [facility]    CSN: [id number] Arrival date & time: 12/23/23  1324      History   Chief Complaint Chief Complaint  Patient presents with   Oral Swelling    Entered by patient    HPI Anthony Farmer is a 33 y.o. male.   Patient presents to clinic over concerns of potential oral thrush.  In late February he had 2 procedures where he had tubes down his throat.  Reports after they took out the tubes that his throat has been well and irritated.  He did go to the dentist last week and they noticed that he had a lot of inflammation around his gums and the roof of his mouth.  Since the dentist appointment his symptoms have gotten worse, and feels like his whole mouth is inflamed.  Eating is very painful.  Feels like the pain is in his throat.  Does not have any trouble swallowing. Able to control secretions.   The history is provided by the patient and medical records.    Past Medical History:  Diagnosis Date   Spontaneous pneumothorax     There are no active problems to display for this patient.   Past Surgical History:  Procedure Laterality Date   HERNIA REPAIR     LUNG SURGERY         Home Medications    Prior to Admission medications   Medication Sig Start Date End Date Taking? Authorizing Provider  CLOMID 50 MG tablet Take 50 mg by mouth daily. 09/23/23  Yes [provider]  fluconazole (DIFLUCAN) 200 MG tablet Take 1 tablet (200 mg total) by mouth daily for 14 days. 12/23/23 01/06/24 Yes Tarius Stangelo  N, FNP  lidocaine (XYLOCAINE) 2 % solution Use as directed 15 mLs in the mouth or throat as needed for mouth pain. 12/23/23  Yes Harlow Lighter, Camora Tremain  N, FNP  magic mouthwash (nystatin, lidocaine, diphenhydrAMINE) suspension Take 5 mLs by mouth 4 (four) times daily as needed for up to 10 days for mouth pain. 12/23/23 01/02/24 Yes Nicholad Kautzman  N, FNP  meloxicam (MOBIC) 15 MG tablet Take 15 mg by mouth daily. 09/11/23  Yes [provider]     Family History History reviewed. No pertinent family history.  Social History Social History   Tobacco Use   Smoking status: Former    Types: Cigarettes   Smokeless tobacco: Never  Vaping Use   Vaping status: Some Days  Substance Use Topics   Alcohol use: Yes    Comment: Occ   Drug use: Yes    Types: Marijuana     Allergies   Penicillin g and Tape   Review of Systems Review of Systems  Per HPI  Physical Exam Triage Vital Signs ED Triage Vitals [12/23/23 1340]  Encounter Vitals Group     BP 115/68     Systolic BP Percentile      Diastolic BP Percentile      Pulse Rate 62     Resp 16     Temp 98.1 F (36.7 C)     Temp Source Oral     SpO2 96 %     Weight 189 lb (85.7 kg)     Height 6\' 5"  (1.956 m)     Head Circumference      Peak Flow      Pain Score 10     Pain Loc      Pain Education      Exclude from Growth Chart  No data found.  Updated Vital Signs BP 115/68 (BP Location: Right Arm)   Pulse 62   Temp 98.1 F (36.7 C) (Oral)   Resp 16   Ht 6\' 5"  (1.956 m)   Wt 189 lb (85.7 kg)   SpO2 96%   BMI 22.41 kg/m   Visual Acuity Right Eye Distance:   Left Eye Distance:   Bilateral Distance:    Right Eye Near:   Left Eye Near:    Bilateral Near:     Physical Exam Vitals and nursing note reviewed.  Constitutional:      Appearance: Normal appearance.  HENT:     Head: Normocephalic and atraumatic.     Right Ear: External ear normal.     Left Ear: External ear normal.     Nose: Nose normal.     Mouth/Throat:     Mouth: Mucous membranes are moist. Oral lesions present.     Tongue: Lesions present.     Palate: Lesions present.     Pharynx: Oropharynx is clear. Uvula midline. No uvula swelling.     Tonsils: No tonsillar exudate or tonsillar abscesses. 0 on the right. 0 on the left.      Comments: White patches to tongue and hard palate.  Diffuse irritation and erythema to gums, both upper and lower. Eyes:     Conjunctiva/sclera:  Conjunctivae normal.  Cardiovascular:     Rate and Rhythm: Normal rate.  Pulmonary:     Effort: Pulmonary effort is normal. No respiratory distress.  Musculoskeletal:        General: Normal range of motion.  Neurological:     General: No focal deficit present.     Mental Status: He is alert.  Psychiatric:        Mood and Affect: Mood normal.        Behavior: Behavior is cooperative.      UC Treatments / Results  Labs (all labs ordered are listed, but only abnormal results are displayed) Labs Reviewed - No data to display  EKG   Radiology No results found.  Procedures Procedures (including critical care time)  Medications Ordered in UC Medications - No data to display  Initial Impression / Assessment and Plan / UC Course  I have reviewed the triage vital signs and the nursing notes.  Pertinent labs & imaging results that were available during my care of the patient were reviewed by me and considered in my medical decision making (see chart for details).  Vitals in triage reviewed, patient is hemodynamically stable.  Oral examination consistent with oral thrush.  Some concern of esophageal involvement, will cover with topical and oral antifungals.  Pain management discussed.  Plan of care, follow-up care return precautions given, no questions at this time.     Final Clinical Impressions(s) / UC Diagnoses   Final diagnoses:  Oral candidiasis     Discharge Instructions      You have an oral fungal infection.  Take thin fluconazole daily for the next 7 to 14 days.  If your symptoms have completely resolved by day 7 then you can discontinue.  If not continue on for the full 14 days.  Use the Magic mouthwash up to 4 times daily.  You can do the lidocaine solution prior to meals, rinse and spit, this will help numb up your mouth to make eating more tolerable.  Ibuprofen 800 mg every 8 hours can help with pain and inflammation.  Symptoms should improve with the  medication,  if no improvement or any changes return to clinic for reevaluation.     ED Prescriptions     Medication Sig Dispense Auth. Provider   magic mouthwash (nystatin, lidocaine, diphenhydrAMINE) suspension Take 5 mLs by mouth 4 (four) times daily as needed for up to 10 days for mouth pain. 180 mL Mohammad Granade  N, FNP   lidocaine (XYLOCAINE) 2 % solution Use as directed 15 mLs in the mouth or throat as needed for mouth pain. 100 mL Harlow Lighter, Madhavi Hamblen  N, FNP   fluconazole (DIFLUCAN) 200 MG tablet Take 1 tablet (200 mg total) by mouth daily for 14 days. 14 tablet Harlow Lighter, Jariyah Hackley  N, FNP      PDMP not reviewed this encounter.   Harlow Lighter, Daishia Fetterly  N, FNP 12/23/23 1406

## 2023-12-23 NOTE — Discharge Instructions (Addendum)
 You have an oral fungal infection.  Take thin fluconazole daily for the next 7 to 14 days.  If your symptoms have completely resolved by day 7 then you can discontinue.  If not continue on for the full 14 days.  Use the Magic mouthwash up to 4 times daily.  You can do the lidocaine solution prior to meals, rinse and spit, this will help numb up your mouth to make eating more tolerable.  Ibuprofen 800 mg every 8 hours can help with pain and inflammation.  Symptoms should improve with the medication, if no improvement or any changes return to clinic for reevaluation.

## 2023-12-23 NOTE — ED Triage Notes (Signed)
 Patient here today with c/o oral pain and swelling X 7-10 days. Patient recently had surgery and has been on medication for that and thinks that he has thrush.
